# Patient Record
Sex: Male | Born: 2000 | Race: White | Hispanic: No | Marital: Single
Health system: Southern US, Community
[De-identification: ages and names within clinical notes are randomized; demographics above are authoritative.]

## PROBLEM LIST (undated history)

## (undated) HISTORY — PX: EYE SURGERY: SHX253

---
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
## 2022-05-10 DIAGNOSIS — Z6826 Body mass index (BMI) 26.0-26.9, adult: Secondary | ICD-10-CM | POA: Diagnosis not present

## 2022-05-10 DIAGNOSIS — R7301 Impaired fasting glucose: Secondary | ICD-10-CM | POA: Diagnosis not present

## 2022-05-10 DIAGNOSIS — Z0001 Encounter for general adult medical examination with abnormal findings: Secondary | ICD-10-CM | POA: Diagnosis not present

## 2022-05-10 DIAGNOSIS — Z1331 Encounter for screening for depression: Secondary | ICD-10-CM | POA: Diagnosis not present

## 2023-10-22 ENCOUNTER — Telehealth: Payer: Self-pay | Admitting: Urology

## 2023-10-22 NOTE — Telephone Encounter (Signed)
Sue Lush with workers comp was calling to see if the pt was scheduled for sx, I informed her that the pt hasn't even been seen yet, has an appt on 10/28/23 with  Dr. Lilian Kapur. She is wanting to know if the ingrown is a result of him running over his foot with a cart on 10/03/23 or not. She has asked for the chart note to be faxed to her @ [phone].

## 2023-10-28 ENCOUNTER — Encounter: Payer: Self-pay | Admitting: Podiatry

## 2023-10-28 ENCOUNTER — Ambulatory Visit (INDEPENDENT_AMBULATORY_CARE_PROVIDER_SITE_OTHER): Payer: No Typology Code available for payment source | Admitting: Podiatry

## 2023-10-28 DIAGNOSIS — L600 Ingrowing nail: Secondary | ICD-10-CM

## 2023-10-28 MED ORDER — NEOMYCIN-POLYMYXIN-HC 3.5-10000-1 OT SUSP: OTIC | 0 refills | Status: AC

## 2023-10-28 NOTE — Patient Instructions (Signed)

## 2023-10-28 NOTE — Progress Notes (Signed)
Subjective:  Patient ID: Austin Durham, male    DOB: 2001-07-06,  MRN: [id number]  Chief Complaint  Patient presents with   Nail Problem    "My toe was ran over by a cart.  Now, I have an ingrown toenail on the left foot."    Discussed the use of AI scribe software for clinical note transcription with the patient, who gave verbal consent to proceed.  History of Present Illness   The patient, who works at [employer], presents with left big toe pain that has been worsening over the past five to six days. The pain began after an incident at work where he hit his toe while pulling a cart. Initially, the injury seemed minor with no bruising or bleeding, and the patient did not think much of it. However, the pain has since increased, particularly on the lateral side of the left big toe. The patient has a history of an ingrown toenail on the right side during college, which required removal after a similar incident. He has been on a ten-day course of antibiotics prescribed by [facility], which has helped to some extent.          Objective:    Physical Exam   EXTREMITIES: Bilateral feet warm and well perfused. Palpable pulses with good capillary fill time. Left hallux has no bruising or ecchymosis, no pain with palpation of the distal phalanx to indicate fracture. Ingrowing nail with previous paronychia and hypergranular tissue from the lateral border.       No images are attached to the encounter.    Results   Procedure: Partial Nail Avulsion with Phenol Matricectomy Description: The left hallux was anesthetized with lidocaine and bupivacaine (Marcaine) 1.5 cc each after sterile prep with alcohol. The left hallux was then prepared with Betadine and a tourniquet secured around the base of the toe. An elevator and nail splitter were used to remove the offending border of the ingrown nail to expose the matrix. Phenol was then applied in three separate applications,  thirty seconds each, to destroy the matrix. The area was irrigated with alcohol and dressed with silvadene and a dry sterile bandage with compression wrap. Informed Consent: Counseling about the risks, benefits, and alternatives to the procedure was provided. The patient was informed that the procedure would involve numbing the toe, removing the ingrown portion of the nail, and applying phenol to prevent regrowth. The patient was also informed about the post-care instructions and potential soreness for a few days.      Assessment:   1. Ingrowing left great toenail      Plan:  Patient was evaluated and treated and all questions answered.  Assessment and Plan    Ingrown Toenail (Left Hallux)   A history of trauma at work led to an initially asymptomatic ingrown toenail, which developed pain and infection over the past 5-6 days. There is a past occurrence of a similar issue on the right side during college. Currently on antibiotics from [facility]. Physical exam shows hypergranular tissue from the lateral border, with no fracture signs. Performed avulsion of the ingrown nail portion with permanent partial matricectomy after informed consent and discussion of risks and benefits. Post-procedure, dressed the area with silvadene and a dry sterile bandage with compression wrap. Prescribed cortisporin ointment for treatment. Provided post-care instructions, including Epsom salt and warm water soaks and covering with a Band-Aid. Healing expected in 2-4 weeks with no activity or shoe gear restrictions.  Return if symptoms worsen or fail to improve.

## 2023-10-31 ENCOUNTER — Ambulatory Visit: Payer: No Typology Code available for payment source | Admitting: Podiatry

## 2023-11-07 ENCOUNTER — Telehealth: Payer: Self-pay

## 2023-11-07 NOTE — Telephone Encounter (Signed)
 Note faxed to the Workers Compensation Adjuster at the number given Confirmation received

## 2023-11-07 NOTE — Telephone Encounter (Signed)
-----   Message from Floyce Hutching sent at 10/28/2023 10:24 AM EST ----- Can you fax his note to his Worker's Comp. adjuster?  Her name is Cain Castillo and her fax number is [phone].  Thank you

## 2024-11-06 ENCOUNTER — Emergency Department (HOSPITAL_COMMUNITY)
Admission: EM | Admit: 2024-11-06 | Discharge: 2024-11-06 | Disposition: A | Discharge status: Home / Self Care | Source: Home / Self Care | Attending: Emergency Medicine | Admitting: Emergency Medicine

## 2024-11-06 ENCOUNTER — Emergency Department (HOSPITAL_COMMUNITY)

## 2024-11-06 ENCOUNTER — Other Ambulatory Visit: Payer: Self-pay

## 2024-11-06 ENCOUNTER — Encounter (HOSPITAL_COMMUNITY): Payer: Self-pay | Admitting: Emergency Medicine

## 2024-11-06 LAB — COMPREHENSIVE METABOLIC PANEL WITH GFR
ALT: 29 U/L (ref 0–44)
AST: 46 U/L — ABNORMAL HIGH (ref 15–41)
Albumin: 4.8 g/dL (ref 3.5–5.0)
Alkaline Phosphatase: 95 U/L (ref 38–126)
Anion gap: 12 (ref 5–15)
BUN: 18 mg/dL (ref 6–20)
CO2: 24 mmol/L (ref 22–32)
Calcium: 9.5 mg/dL (ref 8.9–10.3)
Chloride: 103 mmol/L (ref 98–111)
Creatinine, Ser: 1.24 mg/dL (ref 0.61–1.24)
GFR, Estimated: >60 mL/min
Glucose, Bld: 120 mg/dL — ABNORMAL HIGH (ref 70–99)
Potassium: 3.8 mmol/L (ref 3.5–5.1)
Sodium: 139 mmol/L (ref 135–145)
Total Bilirubin: 0.7 mg/dL (ref 0.0–1.2)
Total Protein: 7.2 g/dL (ref 6.5–8.1)

## 2024-11-06 LAB — URINALYSIS, ROUTINE W REFLEX MICROSCOPIC
Bacteria, UA: NONE SEEN
Bilirubin Urine: NEGATIVE
Glucose, UA: NEGATIVE mg/dL
Ketones, ur: NEGATIVE mg/dL
Leukocytes,Ua: NEGATIVE
Nitrite: NEGATIVE
Protein, ur: NEGATIVE mg/dL
Specific Gravity, Urine: 1.027 (ref 1.005–1.030)
pH: 7 (ref 5.0–8.0)

## 2024-11-06 LAB — ETHANOL: Alcohol, Ethyl (B): <15 mg/dL

## 2024-11-06 LAB — I-STAT CHEM 8, ED
BUN: 21 mg/dL — ABNORMAL HIGH (ref 6–20)
Calcium, Ion: 1.11 mmol/L — ABNORMAL LOW (ref 1.15–1.40)
Chloride: 104 mmol/L (ref 98–111)
Creatinine, Ser: 1.3 mg/dL — ABNORMAL HIGH (ref 0.61–1.24)
Glucose, Bld: 118 mg/dL — ABNORMAL HIGH (ref 70–99)
HCT: 50 % (ref 39.0–52.0)
Hemoglobin: 17 g/dL (ref 13.0–17.0)
Potassium: 3.6 mmol/L (ref 3.5–5.1)
Sodium: 141 mmol/L (ref 135–145)
TCO2: 22 mmol/L (ref 22–32)

## 2024-11-06 LAB — CBC
HCT: 49.3 % (ref 39.0–52.0)
Hemoglobin: 17.3 g/dL — ABNORMAL HIGH (ref 13.0–17.0)
MCH: 31.7 pg (ref 26.0–34.0)
MCHC: 35.1 g/dL (ref 30.0–36.0)
MCV: 90.3 fL (ref 80.0–100.0)
Platelets: 185 10*3/uL (ref 150–400)
RBC: 5.46 MIL/uL (ref 4.22–5.81)
RDW: 12.9 % (ref 11.5–15.5)
WBC: 6.8 10*3/uL (ref 4.0–10.5)
nRBC: 0 % (ref 0.0–0.2)

## 2024-11-06 LAB — SAMPLE TO BLOOD BANK

## 2024-11-06 LAB — I-STAT CG4 LACTIC ACID, ED: Lactic Acid, Venous: 2.5 mmol/L (ref 0.5–1.9)

## 2024-11-06 LAB — PROTIME-INR
INR: 1.1 (ref 0.8–1.2)
Prothrombin Time: 14.3 s (ref 11.4–15.2)

## 2024-11-06 MED ORDER — FENTANYL CITRATE (PF) 50 MCG/ML IJ SOSY: 50.0000 ug | PREFILLED_SYRINGE | Freq: Once | INTRAMUSCULAR | Status: DC

## 2024-11-06 MED ORDER — FENTANYL CITRATE (PF) 50 MCG/ML IJ SOSY
PREFILLED_SYRINGE | INTRAMUSCULAR | Status: AC
  Filled 2024-11-06: qty 1

## 2024-11-06 MED ORDER — ONDANSETRON HCL 4 MG/2ML IJ SOLN
4.0000 mg | Freq: Once | INTRAMUSCULAR | Status: AC
  Administered 2024-11-06: 4 mg via INTRAVENOUS
  Filled 2024-11-06: qty 2

## 2024-11-06 MED ORDER — IOHEXOL 350 MG/ML SOLN
75.0000 mL | Freq: Once | INTRAVENOUS | Status: AC | PRN
  Administered 2024-11-06: 75 mL via INTRAVENOUS

## 2024-11-06 MED ORDER — HYDROMORPHONE HCL 1 MG/ML IJ SOLN
1.0000 mg | Freq: Once | INTRAMUSCULAR | Status: AC
  Administered 2024-11-06: 1 mg via INTRAVENOUS
  Filled 2024-11-06: qty 1

## 2024-11-06 MED ORDER — FENTANYL CITRATE (PF) 50 MCG/ML IJ SOSY
50.0000 ug | PREFILLED_SYRINGE | Freq: Once | INTRAMUSCULAR | Status: AC
  Administered 2024-11-06: 50 ug via INTRAVENOUS
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
